# Patient Record
Sex: Female | Born: 1955 | Race: White | Hispanic: No | Marital: Married
Health system: Southern US, Community
[De-identification: ages and names within clinical notes are randomized; demographics above are authoritative.]

---

## 2005-03-01 ENCOUNTER — Ambulatory Visit: Payer: Self-pay | Admitting: Internal Medicine

## 2006-04-04 ENCOUNTER — Ambulatory Visit: Payer: Self-pay | Admitting: Internal Medicine

## 2007-04-01 ENCOUNTER — Ambulatory Visit: Payer: Self-pay | Admitting: Internal Medicine

## 2008-04-06 ENCOUNTER — Ambulatory Visit: Payer: Self-pay | Admitting: Internal Medicine

## 2009-04-07 ENCOUNTER — Ambulatory Visit: Payer: Self-pay | Admitting: Internal Medicine

## 2010-04-11 ENCOUNTER — Ambulatory Visit: Payer: Self-pay | Admitting: Internal Medicine

## 2011-04-17 ENCOUNTER — Ambulatory Visit: Payer: Self-pay | Admitting: Internal Medicine

## 2012-04-24 ENCOUNTER — Ambulatory Visit: Payer: Self-pay | Admitting: Internal Medicine

## 2013-04-28 ENCOUNTER — Ambulatory Visit: Payer: Self-pay | Admitting: *Deleted

## 2014-04-29 ENCOUNTER — Ambulatory Visit: Payer: Self-pay | Admitting: Internal Medicine

## 2015-09-07 ENCOUNTER — Other Ambulatory Visit: Payer: Self-pay | Admitting: Internal Medicine

## 2015-09-07 DIAGNOSIS — Z1231 Encounter for screening mammogram for malignant neoplasm of breast: Secondary | ICD-10-CM

## 2015-09-21 ENCOUNTER — Ambulatory Visit: Payer: Self-pay

## 2015-09-26 ENCOUNTER — Ambulatory Visit
Admission: RE | Admit: 2015-09-26 | Discharge: 2015-09-26 | Disposition: A | Discharge status: Home / Self Care | Payer: PRIVATE HEALTH INSURANCE | Source: Ambulatory Visit | Attending: Internal Medicine | Admitting: Internal Medicine

## 2015-09-26 DIAGNOSIS — Z1231 Encounter for screening mammogram for malignant neoplasm of breast: Secondary | ICD-10-CM | POA: Diagnosis present

## 2016-10-10 ENCOUNTER — Encounter (INDEPENDENT_AMBULATORY_CARE_PROVIDER_SITE_OTHER): Payer: Self-pay

## 2016-10-10 ENCOUNTER — Ambulatory Visit: Payer: Self-pay | Attending: Oncology | Admitting: *Deleted

## 2016-10-10 ENCOUNTER — Encounter: Payer: Self-pay | Admitting: *Deleted

## 2016-10-10 ENCOUNTER — Ambulatory Visit
Admission: RE | Admit: 2016-10-10 | Discharge: 2016-10-10 | Disposition: A | Discharge status: Home / Self Care | Payer: Self-pay | Source: Ambulatory Visit | Attending: Oncology | Admitting: Oncology

## 2016-10-10 VITALS — BP 143/83 | HR 62 | Temp 97.1°F | Resp 18 | Ht 62.0 in | Wt 140.0 lb

## 2016-10-10 DIAGNOSIS — Z0000 Encounter for general adult medical examination without abnormal findings: Secondary | ICD-10-CM

## 2016-10-10 NOTE — Progress Notes (Signed)
Subjective:     Patient ID: Carrie Foley, female   DOB: 1956-02-28, 61 y.o.   MRN: [id number]  HPI   Review of Systems     Objective:   Physical Exam  Pulmonary/Chest: Right breast exhibits no inverted nipple, no mass, no nipple discharge, no skin change and no tenderness. Left breast exhibits no inverted nipple, no mass, no nipple discharge, no skin change and no tenderness. Breasts are symmetrical.  Abdominal: There is no splenomegaly or hepatomegaly.  Genitourinary: No labial fusion. There is no rash, tenderness, lesion or injury on the right labia. There is no rash, tenderness, lesion or injury on the left labia. Cervix exhibits no motion tenderness, no discharge and no friability. Right adnexum displays no mass, no tenderness and no fullness. Left adnexum displays no mass, no tenderness and no fullness. No erythema, tenderness or bleeding in the vagina. No foreign body in the vagina. No signs of injury around the vagina. No vaginal discharge found.         Assessment:     61 year old White female [facility] by Dr. Maryellen PileEason for clinical breast exam, pap smear and mammogram.  Clinical breast exam unremarkable.  Taught self breast awareness.  Specimen collected for pap smear without difficulty.  Blood pressure elevated at 143/83.  She is to recheck her blood pressure at [facility] or [facility], and if remains higher than 140/90 she is to follow-up with her primary care provider.  Hand out on hypertention given to patient. Patient has been screened for eligibility.  She does not have any insurance, Medicare or Medicaid.  She also meets financial eligibility.  Hand-out given on the Affordable Care Act.    Plan:     Screening mammogram ordered.  Specimen for pap smear sent to the lab.  Will follow-up per [facility] protocol.

## 2016-10-10 NOTE — Patient Instructions (Signed)
Hypertension Hypertension, commonly called high blood pressure, is when the force of blood pumping through the arteries is too strong. The arteries are the blood vessels that carry blood from the heart throughout the body. Hypertension forces the heart to work harder to pump blood and may cause arteries to become narrow or stiff. Having untreated or uncontrolled hypertension can cause heart attacks, strokes, kidney disease, and other problems. A blood pressure reading consists of a higher number over a lower number. Ideally, your blood pressure should be below 120/80. The first ("top") number is called the systolic pressure. It is a measure of the pressure in your arteries as your heart beats. The second ("bottom") number is called the diastolic pressure. It is a measure of the pressure in your arteries as the heart relaxes. What are the causes? The cause of this condition is not known. What increases the risk? Some risk factors for high blood pressure are under your control. Others are not. Factors you can change   Smoking.  Having type 2 diabetes mellitus, high cholesterol, or both.  Not getting enough exercise or physical activity.  Being overweight.  Having too much fat, sugar, calories, or salt (sodium) in your diet.  Drinking too much alcohol. Factors that are difficult or impossible to change   Having chronic kidney disease.  Having a family history of high blood pressure.  Age. Risk increases with age.  Race. You may be at higher risk if you are African-American.  Gender. Men are at higher risk than women before age 45. After age 65, women are at higher risk than men.  Having obstructive sleep apnea.  Stress. What are the signs or symptoms? Extremely high blood pressure (hypertensive crisis) may cause:  Headache.  Anxiety.  Shortness of breath.  Nosebleed.  Nausea and vomiting.  Severe chest pain.  Jerky movements you cannot control (seizures). How is this  diagnosed? This condition is diagnosed by measuring your blood pressure while you are seated, with your arm resting on a surface. The cuff of the blood pressure monitor will be placed directly against the skin of your upper arm at the level of your heart. It should be measured at least twice using the same arm. Certain conditions can cause a difference in blood pressure between your right and left arms. Certain factors can cause blood pressure readings to be lower or higher than normal (elevated) for a short period of time:  When your blood pressure is higher when you are in a health care provider's office than when you are at home, this is called white coat hypertension. Most people with this condition do not need medicines.  When your blood pressure is higher at home than when you are in a health care provider's office, this is called masked hypertension. Most people with this condition may need medicines to control blood pressure. If you have a high blood pressure reading during one visit or you have normal blood pressure with other risk factors:  You may be asked to return on a different day to have your blood pressure checked again.  You may be asked to monitor your blood pressure at home for 1 week or longer. If you are diagnosed with hypertension, you may have other blood or imaging tests to help your health care provider understand your overall risk for other conditions. How is this treated? This condition is treated by making healthy lifestyle changes, such as eating healthy foods, exercising more, and reducing your alcohol intake. Your health   care provider may prescribe medicine if lifestyle changes are not enough to get your blood pressure under control, and if:  Your systolic blood pressure is above 130.  Your diastolic blood pressure is above 80. Your personal target blood pressure may vary depending on your medical conditions, your age, and other factors. Follow these instructions  at home: Eating and drinking   Eat a diet that is high in fiber and potassium, and low in sodium, added sugar, and fat. An example eating plan is called the DASH (Dietary Approaches to Stop Hypertension) diet. To eat this way:  Eat plenty of fresh fruits and vegetables. Try to fill half of your plate at each meal with fruits and vegetables.  Eat whole grains, such as whole wheat pasta, brown rice, or whole grain bread. Fill about one quarter of your plate with whole grains.  Eat or drink low-fat dairy products, such as skim milk or low-fat yogurt.  Avoid fatty cuts of meat, processed or cured meats, and poultry with skin. Fill about one quarter of your plate with lean proteins, such as fish, chicken without skin, beans, eggs, and tofu.  Avoid premade and processed foods. These tend to be higher in sodium, added sugar, and fat.  Reduce your daily sodium intake. Most people with hypertension should eat less than 1,500 mg of sodium a day.  Limit alcohol intake to no more than 1 drink a day for nonpregnant women and 2 drinks a day for men. One drink equals 12 oz of beer, 5 oz of wine, or 1 oz of hard liquor. Lifestyle   Work with your health care provider to maintain a healthy body weight or to lose weight. Ask what an ideal weight is for you.  Get at least 30 minutes of exercise that causes your heart to beat faster (aerobic exercise) most days of the week. Activities may include walking, swimming, or biking.  Include exercise to strengthen your muscles (resistance exercise), such as pilates or lifting weights, as part of your weekly exercise routine. Try to do these types of exercises for 30 minutes at least 3 days a week.  Do not use any products that contain nicotine or tobacco, such as cigarettes and e-cigarettes. If you need help quitting, ask your health care provider.  Monitor your blood pressure at home as told by your health care provider.  Keep all follow-up visits as told by  your health care provider. This is important. Medicines   Take over-the-counter and prescription medicines only as told by your health care provider. Follow directions carefully. Blood pressure medicines must be taken as prescribed.  Do not skip doses of blood pressure medicine. Doing this puts you at risk for problems and can make the medicine less effective.  Ask your health care provider about side effects or reactions to medicines that you should watch for. Contact a health care provider if:  You think you are having a reaction to a medicine you are taking.  You have headaches that keep coming back (recurring).  You feel dizzy.  You have swelling in your ankles.  You have trouble with your vision. Get help right away if:  You develop a severe headache or confusion.  You have unusual weakness or numbness.  You feel faint.  You have severe pain in your chest or abdomen.  You vomit repeatedly.  You have trouble breathing. Summary  Hypertension is when the force of blood pumping through your arteries is too strong. If this condition is   not controlled, it may put you at risk for serious complications.  Your personal target blood pressure may vary depending on your medical conditions, your age, and other factors. For most people, a normal blood pressure is less than 120/80.  Hypertension is treated with lifestyle changes, medicines, or a combination of both. Lifestyle changes include weight loss, eating a healthy, low-sodium diet, exercising more, and limiting alcohol. This information is not intended to replace advice given to you by your health care provider. Make sure you discuss any questions you have with your health care provider. Document Released: 05/07/2005 Document Revised: 04/04/2016 Document Reviewed: 04/04/2016 Elsevier Interactive Patient Education  2017 Elsevier Inc. HPV Test The human papillomavirus (HPV) test is used to look for high-risk types of HPV  infection. HPV is a group of about 100 viruses. Many of these viruses cause growths on, in, or around the genitals. Most HPV viruses cause infections that usually go away without treatment. However, HPV types 6, 11, 16, and 18 are considered high-risk types of HPV that can increase your risk of cancer of the cervix or anus if the infection is left untreated. An HPV test identifies the DNA (genetic) strands of the HPV infection, so it is also referred to as the HPV DNA test. Although HPV is found in both males and females, the HPV test is only used to screen for increased cancer risk in females:  With an abnormal Pap test.  After treatment of an abnormal Pap test.  Between the ages of 30 and 65.  After treatment of a high-risk HPV infection. The HPV test may be done at the same time as a pelvic exam and Pap test in females over the age of 30. Both the HPV test and Pap test require a sample of cells from the cervix. How do I prepare for this test?  Do not douche or take a bath for 24-48 hours before the test or as directed by your health care provider.  Do not have sex for 24-48 hours before the test or as directed by your health care provider.  You may be asked to reschedule the test if you are menstruating.  You will be asked to urinate before the test. What do the results mean? It is your responsibility to obtain your test results. Ask the lab or department performing the test when and how you will get your results. Talk with your health care provider if you have any questions about your results. Your result will be negative or positive. Meaning of Negative Test Results  A negative HPV test result means that no HPV was found, and it is very likely that you do not have HPV. Meaning of Positive Test Results  A positive HPV test result indicates that you have HPV.  If your test result shows the presence of any high-risk HPV strains, you may have an increased risk of developing cancer of the  cervix or anus if the infection is left untreated.  If any low-risk HPV strains are found, you are not likely to have an increased risk of cancer. Discuss your test results with your health care provider. He or she will use the results to make a diagnosis and determine a treatment plan that is right for you. Talk with your health care provider to discuss your results, treatment options, and if necessary, the need for more tests. Talk with your health care provider if you have any questions about your results. This information is not intended to replace   advice given to you by your health care provider. Make sure you discuss any questions you have with your health care provider. Document Released: 06/01/2004 Document Revised: 01/11/2016 Document Reviewed: 09/22/2013 Elsevier Interactive Patient Education  2017 Elsevier Inc.  

## 2016-10-18 LAB — PAP LB AND HPV HIGH-RISK
HPV, HIGH-RISK: NEGATIVE
PAP SMEAR COMMENT: 0

## 2016-10-19 ENCOUNTER — Encounter: Payer: Self-pay | Admitting: *Deleted

## 2016-10-19 NOTE — Progress Notes (Signed)
Letter mailed to inform patient of her normal mammogram and pap smear.  Next mammo in 1 year and pap smear in 5 years.  HSIS to Christy. 

## 2017-02-20 ENCOUNTER — Encounter: Payer: Self-pay | Admitting: Internal Medicine

## 2017-12-12 ENCOUNTER — Other Ambulatory Visit: Payer: Self-pay | Admitting: Family Medicine

## 2017-12-12 DIAGNOSIS — Z1231 Encounter for screening mammogram for malignant neoplasm of breast: Secondary | ICD-10-CM

## 2018-01-17 ENCOUNTER — Ambulatory Visit
Admission: RE | Admit: 2018-01-17 | Discharge: 2018-01-17 | Disposition: A | Discharge status: Home / Self Care | Payer: No Typology Code available for payment source | Source: Ambulatory Visit | Attending: Family Medicine | Admitting: Family Medicine

## 2018-01-17 DIAGNOSIS — Z1231 Encounter for screening mammogram for malignant neoplasm of breast: Secondary | ICD-10-CM

## 2018-10-17 ENCOUNTER — Encounter: Payer: Self-pay | Admitting: Emergency Medicine

## 2018-10-17 ENCOUNTER — Emergency Department: Payer: PRIVATE HEALTH INSURANCE

## 2018-10-17 ENCOUNTER — Emergency Department
Admission: EM | Admit: 2018-10-17 | Discharge: 2018-10-17 | Disposition: A | Discharge status: Home / Self Care | Payer: PRIVATE HEALTH INSURANCE | Attending: Emergency Medicine | Admitting: Emergency Medicine

## 2018-10-17 DIAGNOSIS — R410 Disorientation, unspecified: Secondary | ICD-10-CM | POA: Insufficient documentation

## 2018-10-17 DIAGNOSIS — J45909 Unspecified asthma, uncomplicated: Secondary | ICD-10-CM | POA: Insufficient documentation

## 2018-10-17 DIAGNOSIS — F4322 Adjustment disorder with anxiety: Secondary | ICD-10-CM | POA: Diagnosis not present

## 2018-10-17 DIAGNOSIS — I10 Essential (primary) hypertension: Secondary | ICD-10-CM | POA: Insufficient documentation

## 2018-10-17 LAB — ETHANOL: Alcohol, Ethyl (B): <10 mg/dL (ref <10)

## 2018-10-17 LAB — CBC WITH DIFFERENTIAL/PLATELET
Abs Immature Granulocytes: 0.01 10*3/uL (ref 0.00–0.07)
Basophils Absolute: 0 10*3/uL (ref 0.0–0.1)
Basophils Relative: 0 %
Eosinophils Absolute: 0.1 10*3/uL (ref 0.0–0.5)
Eosinophils Relative: 1 %
HCT: 45.1 % (ref 36.0–46.0)
Hemoglobin: 15.2 g/dL — ABNORMAL HIGH (ref 12.0–15.0)
Immature Granulocytes: 0 %
Lymphocytes Relative: 12 %
Lymphs Abs: 1.2 10*3/uL (ref 0.7–4.0)
MCH: 29.3 pg (ref 26.0–34.0)
MCHC: 33.7 g/dL (ref 30.0–36.0)
MCV: 87.1 fL (ref 80.0–100.0)
Monocytes Absolute: 0.6 10*3/uL (ref 0.1–1.0)
Monocytes Relative: 7 %
Neutro Abs: 7.5 10*3/uL (ref 1.7–7.7)
Neutrophils Relative %: 80 %
Platelets: 312 10*3/uL (ref 150–400)
RBC: 5.18 MIL/uL — ABNORMAL HIGH (ref 3.87–5.11)
RDW: 12 % (ref 11.5–15.5)
WBC: 9.3 10*3/uL (ref 4.0–10.5)
nRBC: 0 % (ref 0.0–0.2)

## 2018-10-17 LAB — URINALYSIS, COMPLETE (UACMP) WITH MICROSCOPIC
Bilirubin Urine: NEGATIVE
Glucose, UA: NEGATIVE mg/dL
Hgb urine dipstick: NEGATIVE
Ketones, ur: NEGATIVE mg/dL
Leukocytes,Ua: NEGATIVE
Nitrite: NEGATIVE
Protein, ur: NEGATIVE mg/dL
Specific Gravity, Urine: 1.013 (ref 1.005–1.030)
pH: 7 (ref 5.0–8.0)

## 2018-10-17 LAB — URINE DRUG SCREEN, QUALITATIVE (ARMC ONLY)
Amphetamines, Ur Screen: NOT DETECTED
Barbiturates, Ur Screen: NOT DETECTED
Benzodiazepine, Ur Scrn: POSITIVE — AB
Cannabinoid 50 Ng, Ur ~~LOC~~: NOT DETECTED
Cocaine Metabolite,Ur ~~LOC~~: NOT DETECTED
MDMA (Ecstasy)Ur Screen: NOT DETECTED
Methadone Scn, Ur: NOT DETECTED
Opiate, Ur Screen: NOT DETECTED
Phencyclidine (PCP) Ur S: NOT DETECTED
Tricyclic, Ur Screen: NOT DETECTED

## 2018-10-17 LAB — COMPREHENSIVE METABOLIC PANEL
ALT: 18 U/L (ref 0–44)
AST: 20 U/L (ref 15–41)
Albumin: 4.5 g/dL (ref 3.5–5.0)
Alkaline Phosphatase: 85 U/L (ref 38–126)
Anion gap: 10 (ref 5–15)
BUN: 14 mg/dL (ref 8–23)
CO2: 27 mmol/L (ref 22–32)
Calcium: 9.6 mg/dL (ref 8.9–10.3)
Chloride: 103 mmol/L (ref 98–111)
Creatinine, Ser: 0.9 mg/dL (ref 0.44–1.00)
GFR calc Af Amer: >60 mL/min (ref >60)
GFR calc non Af Amer: >60 mL/min (ref >60)
Glucose, Bld: 101 mg/dL — ABNORMAL HIGH (ref 70–99)
Potassium: 3.8 mmol/L (ref 3.5–5.1)
Sodium: 140 mmol/L (ref 135–145)
Total Bilirubin: 0.6 mg/dL (ref 0.3–1.2)
Total Protein: 7.9 g/dL (ref 6.5–8.1)

## 2018-10-17 NOTE — ED Notes (Signed)
Patient dressed out by this tech. Pt Belongings: Green shirt Blue Bra White socks Tan underwear [employer] belt [employer] Purse Keys (1)silver hair clip placed in pt's purse

## 2018-10-17 NOTE — ED Provider Notes (Signed)
[facility] Emergency Department Provider Note  ____________________________________________  Time seen: Approximately 2:12 PM  I have reviewed the triage vital signs and the nursing notes.   HISTORY  Chief Complaint Psychiatric Evaluation  Level 5 caveat:  Portions of the history and physical were unable to be obtained due to confusion   HPI Carrie Foley is a 63 y.o. female with h/o HTN and asthma who presents from her PCPs office for confusion.  History is gathered mostly from patient's foster mom via the phone.  According to her patient has had significant short-term memory loss, confusion abnormal behavioral over the last several months.  The foster mom reports that patient will call her several times during the day to ask for simple things such as if she can go to the bathroom, if she can have some water, if she took her medications, and what is she supposed to do that day.  Today she went to the PCP to get her medical records since she is transferring her care to the [place] next week.  While there she kept calling her foster mom and behaving in such way that made the provider who was seen her concerned and sent her to the emergency room.  According to the foster mom this behavior has been ongoing for several months.  She denies any prior history of psychiatric illness, she denies any known drug or alcohol use.  Patient here is able to tell me that she was at her primary care doctor's office today to get her medical records and for some reason they decided to send her over here.  She reports that she became very upset with this.  She denies any suicidal homicidal ideation.  Patient denies any medical complaints at this time including headache, chest pain, fever, shortness of breath, abdominal pain.  She denies drug or alcohol use.  PMH HTN Asthma  Allergies Patient has no known allergies.  Family History  Problem Relation Age of Onset   Breast cancer Neg Hx      Social History Social History   Tobacco Use   Smoking status: Not on file  Substance Use Topics   Alcohol use: Not on file   Drug use: Not on file    Review of Systems  Constitutional: Negative for fever. Eyes: Negative for visual changes. ENT: Negative for sore throat. Neck: No neck pain  Cardiovascular: Negative for chest pain. Respiratory: Negative for shortness of breath. Gastrointestinal: Negative for abdominal pain, vomiting or diarrhea. Genitourinary: Negative for dysuria. Musculoskeletal: Negative for back pain. Skin: Negative for rash. Neurological: Negative for headaches, weakness or numbness. Psych: No SI or HI. + confusion  ____________________________________________   PHYSICAL EXAM:  VITAL SIGNS: ED Triage Vitals  Enc Vitals Group     BP 10/17/18 1119 115/86     Pulse Rate 10/17/18 1119 79     Resp 10/17/18 1119 18     Temp 10/17/18 1119 98.9 F (37.2 C)     Temp Source 10/17/18 1119 Oral     SpO2 10/17/18 1119 96 %     Weight 10/17/18 1120 135 lb (61.2 kg)     Height 10/17/18 1120 5\' 2"  (1.575 m)     Head Circumference --      Peak Flow --      Pain Score 10/17/18 1120 0     Pain Loc --      Pain Edu? --      Excl. in GC? --  Constitutional: Alert and oriented x 3, confused.  HEENT:      Head: Normocephalic and atraumatic.         Eyes: Conjunctivae are normal. Sclera is non-icteric.       Mouth/Throat: Mucous membranes are moist.       Neck: Supple with no signs of meningismus. Cardiovascular: Regular rate and rhythm. No murmurs, gallops, or rubs. 2+ symmetrical distal pulses are present in all extremities. No JVD. Respiratory: Normal respiratory effort. Lungs are clear to auscultation bilaterally. No wheezes, crackles, or rhonchi.  Gastrointestinal: Soft, non tender, and non distended with positive bowel sounds. No rebound or guarding. Musculoskeletal: Nontender with normal range of motion in all extremities. No edema, cyanosis,  or erythema of extremities. Neurologic: Normal speech and language. Face is symmetric. Moving all extremities. No gross focal neurologic deficits are appreciated. Skin: Skin is warm, dry and intact. No rash noted. Psychiatric: Mood and affect are normal. Speech and behavior are normal.  ____________________________________________   LABS (all labs ordered are listed, but only abnormal results are displayed)  Labs Reviewed  COMPREHENSIVE METABOLIC PANEL - Abnormal; Notable for the following components:      Result Value   Glucose, Bld 101 (*)    All other components within normal limits  CBC WITH DIFFERENTIAL/PLATELET - Abnormal; Notable for the following components:   RBC 5.18 (*)    Hemoglobin 15.2 (*)    All other components within normal limits  URINE DRUG SCREEN, QUALITATIVE ([facility] ONLY) - Abnormal; Notable for the following components:   Benzodiazepine, Ur Scrn POSITIVE (*)    All other components within normal limits  ETHANOL   ____________________________________________  EKG  none  ____________________________________________  RADIOLOGY  I have personally reviewed the images performed during this visit and I agree with the Radiologist's read.   Interpretation by Radiologist:  Ct Head Wo Contrast  Result Date: 10/17/2018 CLINICAL DATA:  Altered mental status. EXAM: CT HEAD WITHOUT CONTRAST TECHNIQUE: Contiguous axial images were obtained from the base of the skull through the vertex without intravenous contrast. COMPARISON:  None. FINDINGS: Brain: No evidence of acute infarction, hemorrhage, hydrocephalus, extra-axial collection or mass lesion/mass effect. Vascular: No hyperdense vessel or unexpected calcification. Skull: Normal. Negative for fracture or focal lesion. Sinuses/Orbits: No acute finding. Other: None. IMPRESSION: Normal head CT. Electronically Signed   By: Lupita Raider M.D.   On: 10/17/2018 13:53      ____________________________________________   PROCEDURES  Procedure(s) performed: None Procedures Critical Care performed:  None ____________________________________________   INITIAL IMPRESSION / ASSESSMENT AND PLAN / ED COURSE   63 y.o. female with h/o HTN and asthma who presents from her PCPs office for confusion.  Patient seems to have had confusion, abnormal behavior, and memory problems for several months.  Differential diagnoses including early onset dementia versus psychiatric illness.  Head CT is normal.  Labs are normal.  Drug screen is only positive for benzos.  Review of [place] controlled substance database does not show any prescriptions for benzodiazepines.  Alcohol level is normal.  Psychiatry has been consulted for evaluation.      As part of my medical decision making, I reviewed the following data within the electronic MEDICAL RECORD NUMBER History obtained from family, Nursing notes reviewed and incorporated, Labs reviewed , A consult was requested and obtained from this/these consultant(s) Psychiatry, Notes from prior ED visits and [place] Controlled Substance Database    Pertinent labs & imaging results that were available during my care of the patient  were reviewed by me and considered in my medical decision making (see chart for details).    ____________________________________________   FINAL CLINICAL IMPRESSION(S) / ED DIAGNOSES  Final diagnoses:  Confusion      NEW MEDICATIONS STARTED DURING THIS VISIT:  ED Discharge Orders    None       Note:  This document was prepared using Dragon voice recognition software and may include unintentional dictation errors.    Don PerkingVeronese, [place], MD 10/17/18 [phone]

## 2018-10-17 NOTE — Discharge Instructions (Addendum)
Please seek medical attention and help for any thoughts about wanting to harm yourself, harm others, any concerning change in behavior, severe depression, inappropriate drug use or any other new or concerning symptoms. ° °

## 2018-10-17 NOTE — Consult Note (Signed)
[facility] Face-to-Face Psychiatry Consult   Reason for Consult:  Confusion, agitation, anxiety Referring Physician:  EDP Patient Identification: Carrie Foley MRN:  [id number] Principal Diagnosis: Adjustment disorder with anxious mood Diagnosis:  Principal Problem:   Adjustment disorder with anxious mood   Total Time spent with patient: 45 minutes  Subjective:  "I went to the grocery store and then went to pick up papers at my doctor's office for the [place].  They sent me here."  HPI:  Carrie Foley is a 62 y.o. female patient was transferred from her PCP after having some anxiety and confusion.  When told she was coming to the hospital, she became agitated.   On assessment, she is anxious but not agitated, no confusion noted.  Alert and oriented times four.  Reports, "I went to the grocery store and then went to pick up papers at my doctor's office for the [place]."  She is transferring her care to the [place].  Denies suicidal/homicidal ideations, hallucinations, and substance abuse.  Lives alone since her husband died in Oct 09, 2005.  Her boyfriend helps her out and took her to the grocery store and her doctor today.  He left as he had to return to work.  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  Most recently she was working a 4 pm to 12 am job [employer], "it kept me grounded".  She lost this job in 2015/10/10 and does report missing it.  Denies any psychiatric history except anxiety.  Identified her Symbicort causes her to be "nervous", which is correct.  Her foster/adopted parents were contacted and reported she has been calling frequently and asking questions for things she already knows.  Her adopted mother confirmed the patient was obtaining papers to transfer her care to the [place].  Her story is consistent.  Does report some issues  with short term memory loss during her calls.  The patient wants to return home but concerned about how she is going to get there.  Care management notified to assist her as she did not drive herself to the hospital if medically cleared.  Psychiatrically cleared at this time.    Past Psychiatric History: none   Risk to Self:   none Risk to Others:  none Prior Inpatient Therapy:  none Prior Outpatient Therapy:  none  Past Medical History: History reviewed. No pertinent past medical history. History reviewed. No pertinent surgical history. Family History:  Family History  Problem Relation Age of Onset  . Breast cancer Neg Hx    Family Psychiatric  History: none Social History:  Social History   Substance and Sexual Activity  Alcohol Use Not on file     Social History   Substance and Sexual Activity  Drug Use Not on file    Social History   Socioeconomic History  . Marital status: Married    Spouse name: Not on file  . Number of children: Not on file  . Years of education: Not on file  . Highest education level: Not on file  Occupational History  . Not on file  Social Needs  . Financial resource strain: Not on file  . Food insecurity:    Worry: Not on file    Inability: Not on file  . Transportation needs:    Medical: Not  on file    Non-medical: Not on file  Tobacco Use  . Smoking status: Not on file  Substance and Sexual Activity  . Alcohol use: Not on file  . Drug use: Not on file  . Sexual activity: Not on file  Lifestyle  . Physical activity:    Days per week: Not on file    Minutes per session: Not on file  . Stress: Not on file  Relationships  . Social connections:    Talks on phone: Not on file    Gets together: Not on file    Attends religious service: Not on file    Active member of club or organization: Not on file    Attends meetings of clubs or organizations: Not on file    Relationship status: Not on file  Other Topics Concern  . Not on  file  Social History Narrative  . Not on file   Additional Social History:    Allergies:  No Known Allergies  Labs:  Results for orders placed or performed during the hospital encounter of 10/17/18 (from the past 48 hour(s))  Comprehensive metabolic panel     Status: Abnormal   Collection Time: 10/17/18 11:50 AM  Result Value Ref Range   Sodium 140 135 - 145 mmol/L   Potassium 3.8 3.5 - 5.1 mmol/L   Chloride 103 98 - 111 mmol/L   CO2 27 22 - 32 mmol/L   Glucose, Bld 101 (H) 70 - 99 mg/dL   BUN 14 8 - 23 mg/dL   Creatinine, Ser 4.09 0.44 - 1.00 mg/dL   Calcium 9.6 8.9 - 81.1 mg/dL   Total Protein 7.9 6.5 - 8.1 g/dL   Albumin 4.5 3.5 - 5.0 g/dL   AST 20 15 - 41 U/L   ALT 18 0 - 44 U/L   Alkaline Phosphatase 85 38 - 126 U/L   Total Bilirubin 0.6 0.3 - 1.2 mg/dL   GFR calc non Af Amer >60 >60 mL/min   GFR calc Af Amer >60 >60 mL/min   Anion gap 10 5 - 15    Comment: Performed at [facility], [address] [place], [place] [address]  Ethanol     Status: None   Collection Time: 10/17/18 11:50 AM  Result Value Ref Range   Alcohol, Ethyl (B) <10 <10 mg/dL    Comment: (NOTE) Lowest detectable limit for serum alcohol is 10 mg/dL. For medical purposes only. Performed at [facility], [address] [place], [place] [address]   CBC with Differential/Platelet     Status: Abnormal   Collection Time: 10/17/18 11:50 AM  Result Value Ref Range   WBC 9.3 4.0 - 10.5 K/uL   RBC 5.18 (H) 3.87 - 5.11 MIL/uL   Hemoglobin 15.2 (H) 12.0 - 15.0 g/dL   HCT 13.0 86.5 - 78.4 %   MCV 87.1 80.0 - 100.0 fL   MCH 29.3 26.0 - 34.0 pg   MCHC 33.7 30.0 - 36.0 g/dL   RDW 69.6 29.5 - 28.4 %   Platelets 312 150 - 400 K/uL   nRBC 0.0 0.0 - 0.2 %   Neutrophils Relative % 80 %   Neutro Abs 7.5 1.7 - 7.7 K/uL   Lymphocytes Relative 12 %   Lymphs Abs 1.2 0.7 - 4.0 K/uL   Monocytes Relative 7 %   Monocytes Absolute 0.6 0.1 - 1.0 K/uL   Eosinophils Relative 1 %   Eosinophils  Absolute 0.1 0.0 - 0.5 K/uL  Basophils Relative 0 %   Basophils Absolute 0.0 0.0 - 0.1 K/uL   Immature Granulocytes 0 %   Abs Immature Granulocytes 0.01 0.00 - 0.07 K/uL    Comment: Performed at [facility], [address] [place], [place] [address]  Urinalysis, Complete w Microscopic     Status: Abnormal   Collection Time: 10/17/18 11:50 AM  Result Value Ref Range   Color, Urine YELLOW (A) YELLOW   APPearance CLOUDY (A) CLEAR   Specific Gravity, Urine 1.013 1.005 - 1.030   pH 7.0 5.0 - 8.0   Glucose, UA NEGATIVE NEGATIVE mg/dL   Hgb urine dipstick NEGATIVE NEGATIVE   Bilirubin Urine NEGATIVE NEGATIVE   Ketones, ur NEGATIVE NEGATIVE mg/dL   Protein, ur NEGATIVE NEGATIVE mg/dL   Nitrite NEGATIVE NEGATIVE   Leukocytes,Ua NEGATIVE NEGATIVE    Comment: Performed at [facility], [address] [place], [place] [address]  Urine Drug Screen, Qualitative ([facility] only)     Status: Abnormal   Collection Time: 10/17/18 11:56 AM  Result Value Ref Range   Tricyclic, Ur Screen NONE DETECTED NONE DETECTED   Amphetamines, Ur Screen NONE DETECTED NONE DETECTED   MDMA (Ecstasy)Ur Screen NONE DETECTED NONE DETECTED   Cocaine Metabolite,Ur [place] NONE DETECTED NONE DETECTED   Opiate, Ur Screen NONE DETECTED NONE DETECTED   Phencyclidine (PCP) Ur S NONE DETECTED NONE DETECTED   Cannabinoid 50 Ng, Ur [place] NONE DETECTED NONE DETECTED   Barbiturates, Ur Screen NONE DETECTED NONE DETECTED   Benzodiazepine, Ur Scrn POSITIVE (A) NONE DETECTED   Methadone Scn, Ur NONE DETECTED NONE DETECTED    Comment: (NOTE) Tricyclics + metabolites, urine    Cutoff 1000 ng/mL Amphetamines + metabolites, urine  Cutoff 1000 ng/mL MDMA (Ecstasy), urine              Cutoff 500 ng/mL Cocaine Metabolite, urine          Cutoff 300 ng/mL Opiate + metabolites, urine        Cutoff 300 ng/mL Phencyclidine (PCP), urine         Cutoff 25 ng/mL Cannabinoid, urine                 Cutoff 50 ng/mL Barbiturates +  metabolites, urine  Cutoff 200 ng/mL Benzodiazepine, urine              Cutoff 200 ng/mL Methadone, urine                   Cutoff 300 ng/mL The urine drug screen provides only a preliminary, unconfirmed analytical test result and should not be used for non-medical purposes. Clinical consideration and professional judgment should be applied to any positive drug screen result due to possible interfering substances. A more specific alternate chemical method must be used in order to obtain a confirmed analytical result. Gas chromatography / mass spectrometry (GC/Carrie) is the preferred confirmat ory method. Performed at [facility], [address] [place], [place] [address]     No current facility-administered medications for this encounter.    No current outpatient medications on file.    Musculoskeletal: Strength & Muscle Tone: within normal limits Gait & Station: normal Patient leans: N/A  Psychiatric Specialty Exam: Physical Exam  Nursing note and vitals reviewed. Constitutional: She is oriented to person, place, and time. She appears well-developed and well-nourished.  HENT:  Head: Normocephalic.  Neck: Normal range of motion.  Respiratory: Effort normal.  Musculoskeletal: Normal range of motion.  Neurological: She is  alert and oriented to person, place, and time.  Psychiatric: Her speech is normal and behavior is normal. Judgment and thought content normal. Her mood appears anxious. Cognition and memory are normal.    Review of Systems  Psychiatric/Behavioral: The patient is nervous/anxious.   All other systems reviewed and are negative.   Blood pressure 115/86, pulse 79, temperature 98.9 F (37.2 C), temperature source Oral, resp. rate 18, height 5\' 2"  (1.575 m), weight 61.2 kg, SpO2 96 %.Body mass index is 24.69 kg/m.  General Appearance: Casual  Eye Contact:  Good  Speech:  Normal Rate  Volume:  Normal  Mood:  Anxious  Affect:  Congruent  Thought  Process:  Coherent and Descriptions of Associations: Intact  Orientation:  Full (Time, Place, and Person)  Thought Content:  WDL and Logical  Suicidal Thoughts:  No  Homicidal Thoughts:  No  Memory:  Immediate;   Good Recent;   Good Remote;   Good  Judgement:  Good  Insight:  Good  Psychomotor Activity:  Normal  Concentration:  Concentration: Good and Attention Span: Good  Recall:  Good  Fund of Knowledge:  Good  Language:  Good  Akathisia:  No  Handed:  Right  AIMS (if indicated):     Assets:  Leisure Time Physical Health Resilience Social Support  ADL's:  Intact  Cognition:  WNL  Sleep:        Treatment Plan Summary: Adjustment disorder with anxious mood: -Recommend follow up with a neurologist for short term memory issues when medically cleared -Dr Lucianne Muss consulted and concurs with the plan  Disposition: No evidence of imminent risk to self or others at present.    Nanine Means, NP 10/17/2018 3:33 PM

## 2018-10-17 NOTE — ED Notes (Signed)
VOL/  PENDING  CONSULT 

## 2018-10-17 NOTE — ED Notes (Addendum)
Spoke with patients foster mother Marijean Bravo home [phone] mother says if she is not there leave a message Patients mother also wanted to make sure that the patient cant call her anytime she wants, she says that the patient has been excessively calling her, and she said patients behavior has become worse over the past few days.  Patients mother is very supportive and says if she needs to be here anytime she will come.

## 2018-10-17 NOTE — ED Notes (Signed)
Pt repeatedly yelling out, "Nurse!"  Informed patient that frequent rounding is done on all patients and to please not yell as it disturbs the other patients.

## 2018-10-17 NOTE — ED Triage Notes (Signed)
Patient presents to the ED via EMS for psychiatric evaluation.  EMS was called by patient's PCP due to patient's confusion, agitation and anxiety.  Patient has no history of psychiatric problems.  Per EMS patient is very agitated and thoughts are very scattered.  Patient has been calling her mother daily saying the same confusing things per EMS.  Patient denies SI/HI and hallucinations.

## 2018-10-17 NOTE — ED Notes (Signed)
Patient assigned to appropriate care area   Introduced self to pt  Patient oriented to unit/care area: Informed that, for their safety, care areas are designed for safety and visiting and phone hours explained to patient. Patient verbalizes understanding, and verbal contract for safety obtained  Environment secured    Patient presents to the ED via EMS for psychiatric evaluation.  EMS was called by patient's PCP due to patient's confusion, agitation and anxiety.  Patient has no history of psychiatric problems. Patient appears to be in a manic state and thoughts are very scattered. Patient denies SI/HI and hallucinations.

## 2018-10-17 NOTE — ED Notes (Signed)
Went with patient to ct at this time.

## 2018-10-17 NOTE — ED Notes (Signed)
Pt discharged to home.  Verbalized understanding of discharge instructions and follow up care.

## 2019-06-23 IMAGING — CT CT HEAD WITHOUT CONTRAST
3 series · 16 of 44 positions shown, 19 images · non-contrast
Comparison: None.

CLINICAL DATA: Altered mental status.

EXAM:
CT HEAD WITHOUT CONTRAST
TECHNIQUE: Contiguous axial images were obtained from the base of the skull
through the vertex without intravenous contrast.

[Series 2: head wo · axial · 0.40mm/px · z∈[+333,+443]mm · 10 of 27 slices shown, 13 images]
[im 3/27  brain]
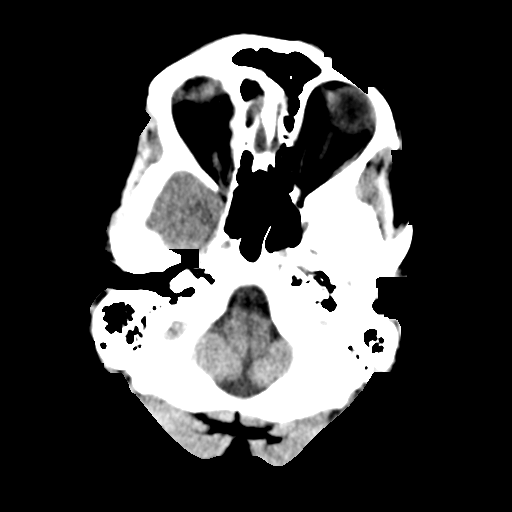
[im 3/27  bone]
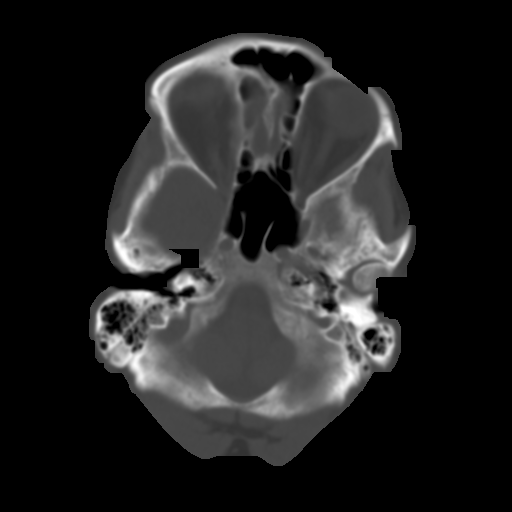
[im 5/27  brain]
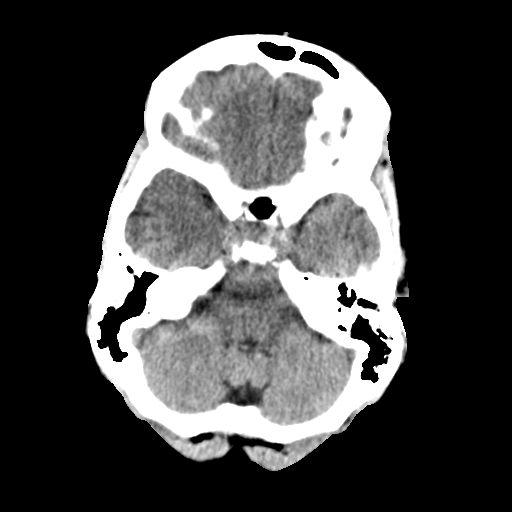
[im 8/27  brain]
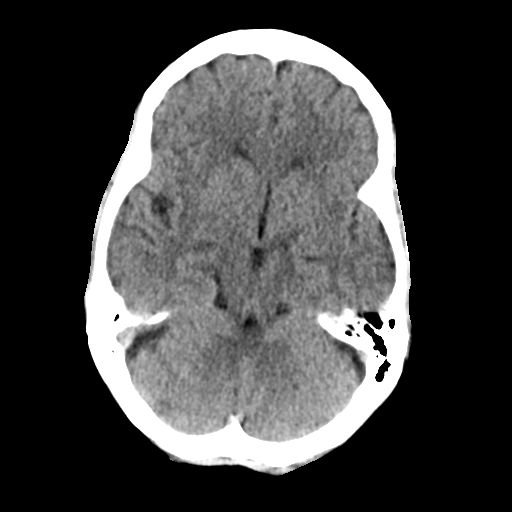
[im 10/27  brain]
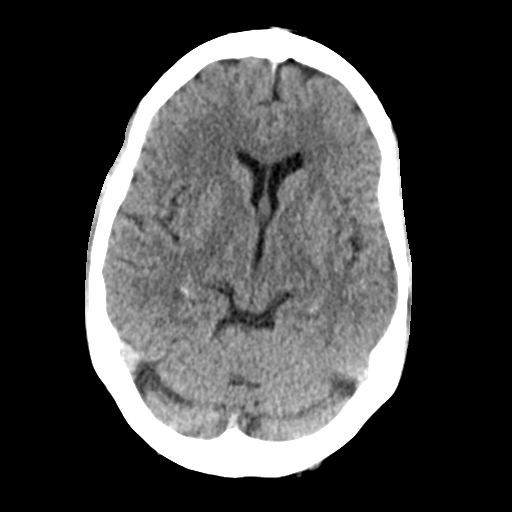
[im 13/27  brain]
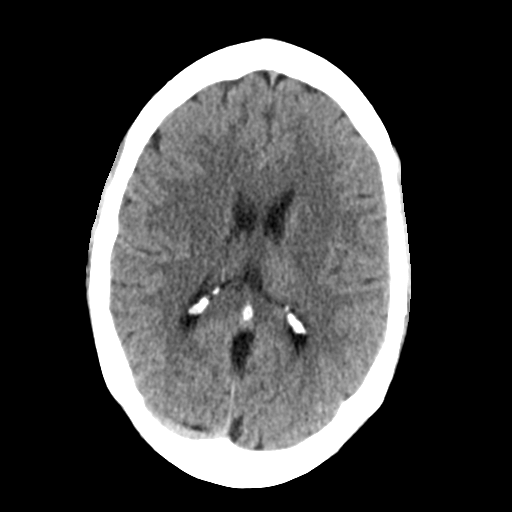
[im 13/27  bone]
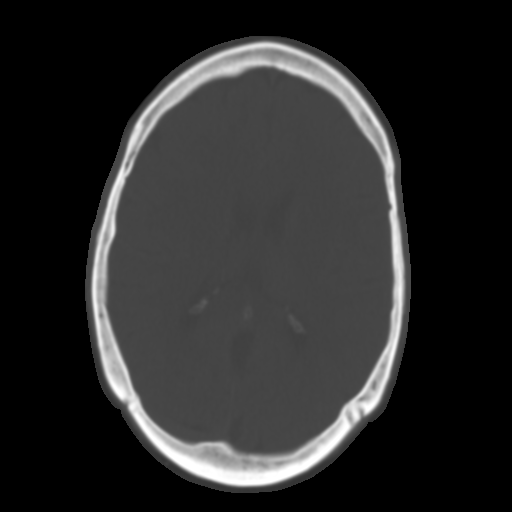
[im 15/27  brain]
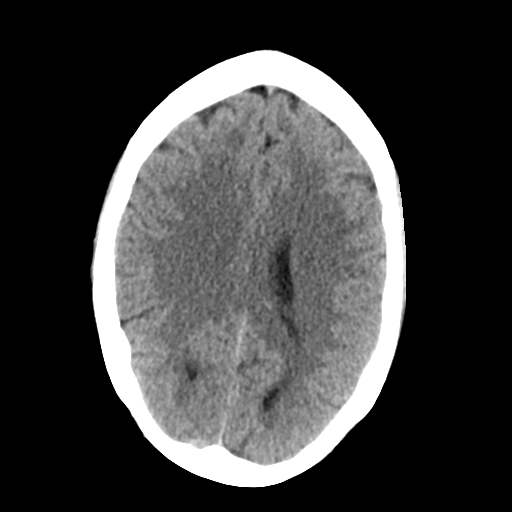
[im 18/27  brain]
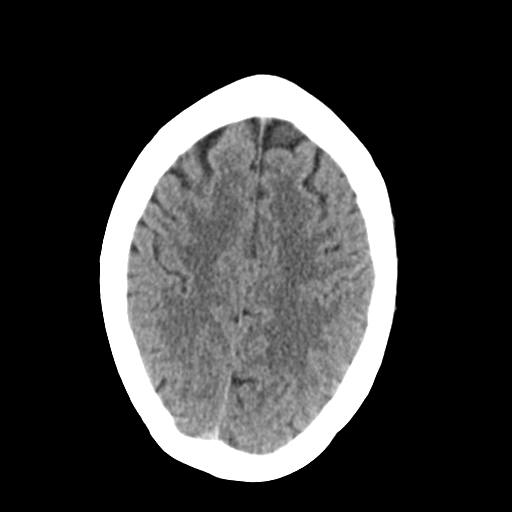
[im 20/27  brain]
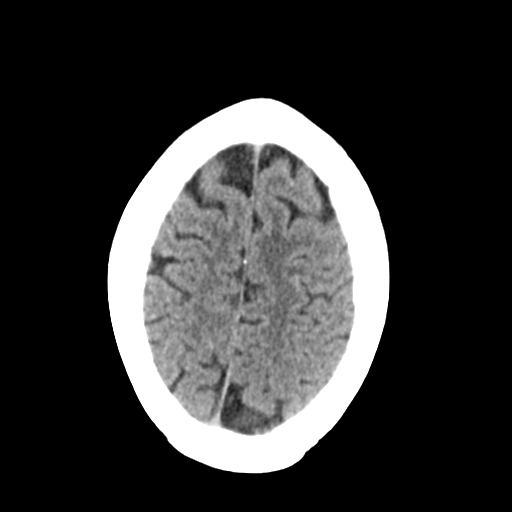
[im 23/27  brain]
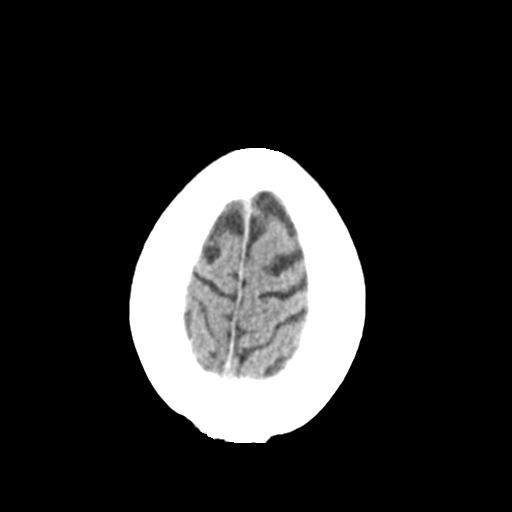
[im 23/27  bone]
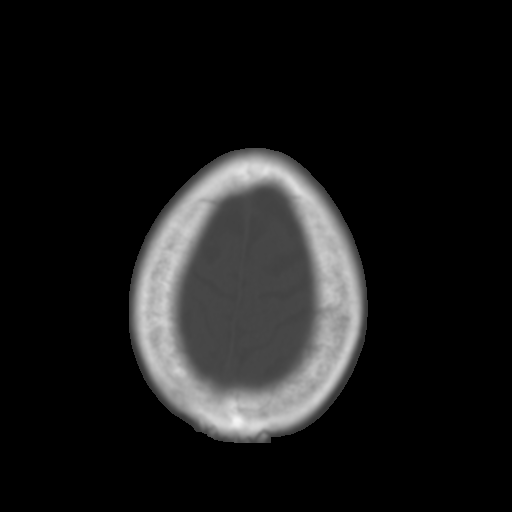
[im 25/27  brain]
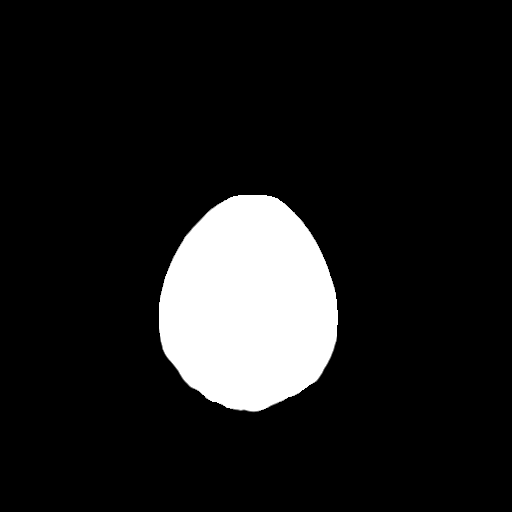

[Series 4: coronal soft tissue · coronal · 0.28mm/px · 3 of 64 slices shown]
[im 22/64  brain]
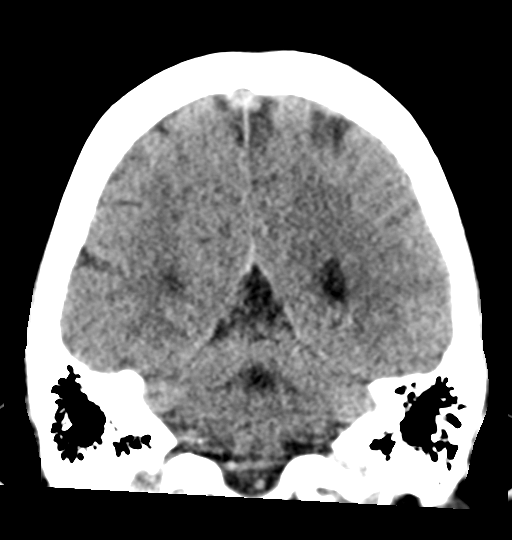
[im 29/64  brain]
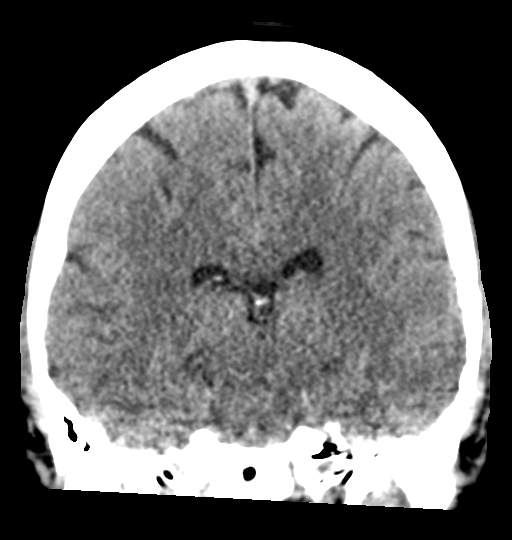
[im 36/64  brain]
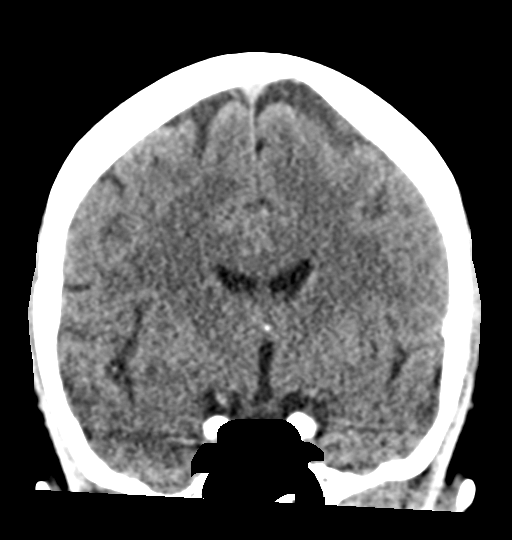

[Series 5: sagittal soft tissue · sagittal · 0.30mm/px · 3 of 49 slices shown]
[im 17/49  brain]
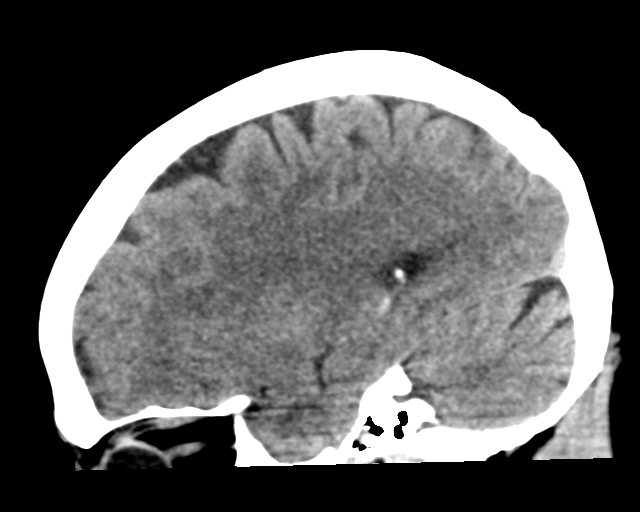
[im 25/49  brain]
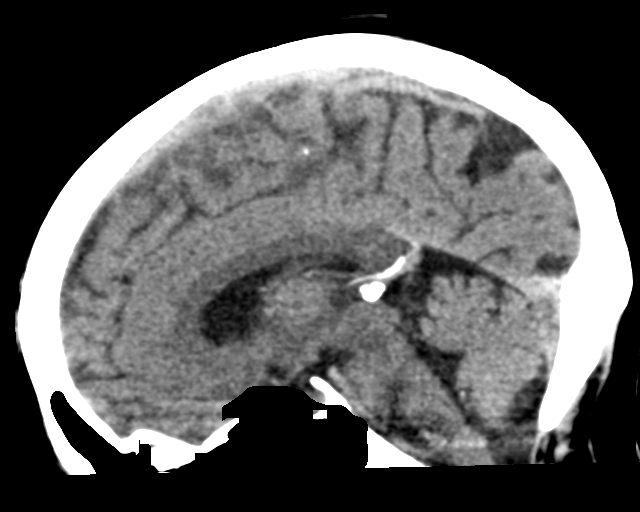
[im 33/49  brain]
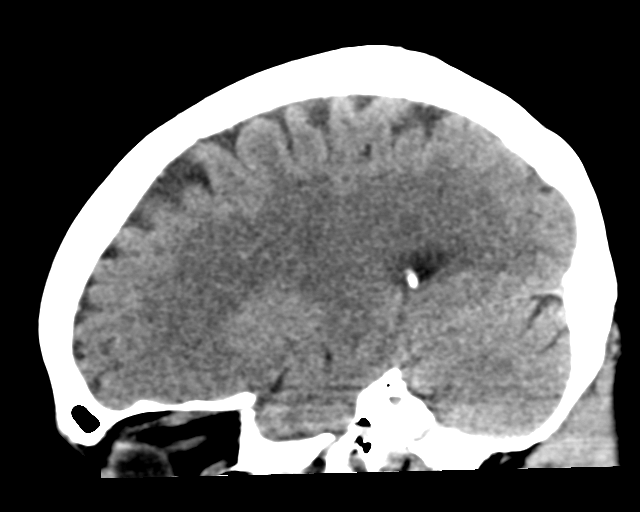

[16 of 44 positions shown; findings below may reference images not displayed]

FINDINGS: Brain: No evidence of acute infarction, hemorrhage, hydrocephalus,
extra-axial collection or mass lesion/mass effect.

Vascular: No hyperdense vessel or unexpected calcification.

Skull: Normal. Negative for fracture or focal lesion.

Sinuses/Orbits: No acute finding.

Other: None.
IMPRESSION: Normal head CT.
